# Patient Record
Sex: Female | Born: 1975 | Race: White | Hispanic: No | Marital: Married | Smoking: Former smoker
Health system: Southern US, Community
[De-identification: ages and names within clinical notes are randomized; demographics above are authoritative.]

## PROBLEM LIST (undated history)

## (undated) ENCOUNTER — Emergency Department (HOSPITAL_BASED_OUTPATIENT_CLINIC_OR_DEPARTMENT_OTHER): Discharge status: Absent without leave | Payer: No Typology Code available for payment source

## (undated) DIAGNOSIS — F32A Depression, unspecified: Secondary | ICD-10-CM

## (undated) DIAGNOSIS — F329 Major depressive disorder, single episode, unspecified: Secondary | ICD-10-CM

---

## 1997-05-08 ENCOUNTER — Inpatient Hospital Stay (HOSPITAL_COMMUNITY): Admission: AD | Admit: 1997-05-08 | Discharge: 1997-05-08 | Payer: Self-pay | Admitting: Obstetrics and Gynecology

## 1997-06-03 ENCOUNTER — Inpatient Hospital Stay (HOSPITAL_COMMUNITY): Admission: AD | Admit: 1997-06-03 | Discharge: 1997-06-06 | Payer: Self-pay | Admitting: Obstetrics and Gynecology

## 1997-07-23 ENCOUNTER — Other Ambulatory Visit: Admission: RE | Admit: 1997-07-23 | Discharge: 1997-07-23 | Payer: Self-pay | Admitting: Obstetrics & Gynecology

## 2002-07-16 ENCOUNTER — Emergency Department (HOSPITAL_COMMUNITY): Admission: EM | Admit: 2002-07-16 | Discharge: 2002-07-16 | Payer: Self-pay | Admitting: Emergency Medicine

## 2002-07-16 ENCOUNTER — Encounter: Payer: Self-pay | Admitting: Emergency Medicine

## 2002-09-06 ENCOUNTER — Encounter: Payer: Self-pay | Admitting: Occupational Medicine

## 2002-09-06 ENCOUNTER — Encounter: Admission: RE | Admit: 2002-09-06 | Discharge: 2002-09-06 | Payer: Self-pay | Admitting: Occupational Medicine

## 2007-04-10 ENCOUNTER — Encounter: Admission: RE | Admit: 2007-04-10 | Discharge: 2007-04-10 | Payer: Self-pay | Admitting: Rheumatology

## 2009-08-20 ENCOUNTER — Encounter: Admission: RE | Admit: 2009-08-20 | Discharge: 2009-08-20 | Payer: Self-pay | Admitting: Obstetrics and Gynecology

## 2009-10-14 ENCOUNTER — Inpatient Hospital Stay (HOSPITAL_COMMUNITY): Admission: RE | Admit: 2009-10-14 | Discharge: 2009-10-17 | Payer: Self-pay | Admitting: Obstetrics & Gynecology

## 2010-04-16 LAB — CBC
Hemoglobin: 10.5 g/dL — ABNORMAL LOW (ref 12.0–15.0)
Hemoglobin: 12.5 g/dL (ref 12.0–15.0)
MCH: 32 pg (ref 26.0–34.0)
MCHC: 33.8 g/dL (ref 30.0–36.0)
Platelets: 215 10*3/uL (ref 150–400)
RBC: 3.88 MIL/uL (ref 3.87–5.11)
RDW: 14.7 % (ref 11.5–15.5)
WBC: 12.1 10*3/uL — ABNORMAL HIGH (ref 4.0–10.5)

## 2010-04-16 LAB — SURGICAL PCR SCREEN
MRSA, PCR: NEGATIVE
Staphylococcus aureus: NEGATIVE

## 2010-04-16 LAB — RPR: RPR Ser Ql: NONREACTIVE

## 2013-04-10 ENCOUNTER — Other Ambulatory Visit: Payer: Self-pay | Admitting: Obstetrics & Gynecology

## 2013-04-10 DIAGNOSIS — Z1231 Encounter for screening mammogram for malignant neoplasm of breast: Secondary | ICD-10-CM

## 2013-05-09 ENCOUNTER — Other Ambulatory Visit: Payer: Self-pay

## 2013-05-09 DIAGNOSIS — Z1231 Encounter for screening mammogram for malignant neoplasm of breast: Secondary | ICD-10-CM

## 2014-09-29 ENCOUNTER — Encounter (HOSPITAL_BASED_OUTPATIENT_CLINIC_OR_DEPARTMENT_OTHER): Payer: Self-pay | Admitting: Emergency Medicine

## 2014-09-29 ENCOUNTER — Emergency Department (HOSPITAL_BASED_OUTPATIENT_CLINIC_OR_DEPARTMENT_OTHER)
Admission: EM | Admit: 2014-09-29 | Discharge: 2014-09-29 | Disposition: A | Discharge status: Home / Self Care | Payer: No Typology Code available for payment source | Attending: Emergency Medicine | Admitting: Emergency Medicine

## 2014-09-29 DIAGNOSIS — Y9389 Activity, other specified: Secondary | ICD-10-CM | POA: Insufficient documentation

## 2014-09-29 DIAGNOSIS — Z79899 Other long term (current) drug therapy: Secondary | ICD-10-CM | POA: Insufficient documentation

## 2014-09-29 DIAGNOSIS — F329 Major depressive disorder, single episode, unspecified: Secondary | ICD-10-CM | POA: Diagnosis not present

## 2014-09-29 DIAGNOSIS — Y92410 Unspecified street and highway as the place of occurrence of the external cause: Secondary | ICD-10-CM | POA: Diagnosis not present

## 2014-09-29 DIAGNOSIS — Y998 Other external cause status: Secondary | ICD-10-CM | POA: Diagnosis not present

## 2014-09-29 DIAGNOSIS — T148XXA Other injury of unspecified body region, initial encounter: Secondary | ICD-10-CM

## 2014-09-29 DIAGNOSIS — Z87891 Personal history of nicotine dependence: Secondary | ICD-10-CM | POA: Insufficient documentation

## 2014-09-29 DIAGNOSIS — S46912A Strain of unspecified muscle, fascia and tendon at shoulder and upper arm level, left arm, initial encounter: Secondary | ICD-10-CM | POA: Insufficient documentation

## 2014-09-29 DIAGNOSIS — S199XXA Unspecified injury of neck, initial encounter: Secondary | ICD-10-CM | POA: Insufficient documentation

## 2014-09-29 DIAGNOSIS — S4992XA Unspecified injury of left shoulder and upper arm, initial encounter: Secondary | ICD-10-CM | POA: Diagnosis present

## 2014-09-29 HISTORY — DX: Depression, unspecified: F32.A

## 2014-09-29 HISTORY — DX: Major depressive disorder, single episode, unspecified: F32.9

## 2014-09-29 MED ORDER — OXYCODONE-ACETAMINOPHEN 5-325 MG PO TABS
2.0000 | ORAL_TABLET | Freq: Once | ORAL | Status: AC
  Administered 2014-09-29: 2 via ORAL
  Filled 2014-09-29: qty 2

## 2014-09-29 MED ORDER — IBUPROFEN 800 MG PO TABS
800.0000 mg | ORAL_TABLET | Freq: Once | ORAL | Status: AC
  Administered 2014-09-29: 800 mg via ORAL
  Filled 2014-09-29: qty 1

## 2014-09-29 NOTE — ED Notes (Signed)
Pt able to use left arm without apparent difficulty or disability while taking medications

## 2014-09-29 NOTE — ED Notes (Signed)
EDP Dr. Rosalia Hammers into room.

## 2014-09-29 NOTE — ED Notes (Signed)
Pt care assumed after Dr exam, pt cleared for discharge.
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
## 2015-08-07 ENCOUNTER — Other Ambulatory Visit: Payer: Self-pay | Admitting: Obstetrics & Gynecology

## 2015-08-07 DIAGNOSIS — R928 Other abnormal and inconclusive findings on diagnostic imaging of breast: Secondary | ICD-10-CM

## 2015-08-12 ENCOUNTER — Ambulatory Visit
Admission: RE | Admit: 2015-08-12 | Discharge: 2015-08-12 | Disposition: A | Discharge status: Home / Self Care | Payer: BLUE CROSS/BLUE SHIELD | Source: Ambulatory Visit | Attending: Obstetrics & Gynecology | Admitting: Obstetrics & Gynecology

## 2015-08-12 DIAGNOSIS — R928 Other abnormal and inconclusive findings on diagnostic imaging of breast: Secondary | ICD-10-CM

## 2017-01-05 ENCOUNTER — Other Ambulatory Visit: Payer: Self-pay | Admitting: Obstetrics & Gynecology

## 2017-01-05 DIAGNOSIS — Z1231 Encounter for screening mammogram for malignant neoplasm of breast: Secondary | ICD-10-CM

## 2017-01-06 ENCOUNTER — Ambulatory Visit
Admission: RE | Admit: 2017-01-06 | Discharge: 2017-01-06 | Disposition: A | Discharge status: Home / Self Care | Payer: BLUE CROSS/BLUE SHIELD | Source: Ambulatory Visit | Attending: Obstetrics & Gynecology | Admitting: Obstetrics & Gynecology

## 2017-01-06 DIAGNOSIS — Z1231 Encounter for screening mammogram for malignant neoplasm of breast: Secondary | ICD-10-CM

## 2018-11-15 ENCOUNTER — Other Ambulatory Visit: Payer: Self-pay | Admitting: Family Medicine

## 2018-11-15 DIAGNOSIS — Z1231 Encounter for screening mammogram for malignant neoplasm of breast: Secondary | ICD-10-CM

## 2019-01-04 ENCOUNTER — Ambulatory Visit
Admission: RE | Admit: 2019-01-04 | Discharge: 2019-01-04 | Disposition: A | Discharge status: Home / Self Care | Payer: BLUE CROSS/BLUE SHIELD | Source: Ambulatory Visit | Attending: Family Medicine | Admitting: Family Medicine

## 2019-01-04 ENCOUNTER — Other Ambulatory Visit: Payer: Self-pay

## 2019-01-04 DIAGNOSIS — Z1231 Encounter for screening mammogram for malignant neoplasm of breast: Secondary | ICD-10-CM

## 2020-06-20 IMAGING — MG DIGITAL SCREENING BILAT W/ CAD
2 series · 2 of 2 positions shown · non-contrast
Comparison: Previous exam(s).

CLINICAL DATA: Screening.

EXAM:
DIGITAL SCREENING BILATERAL MAMMOGRAM WITH CAD

[L CC]
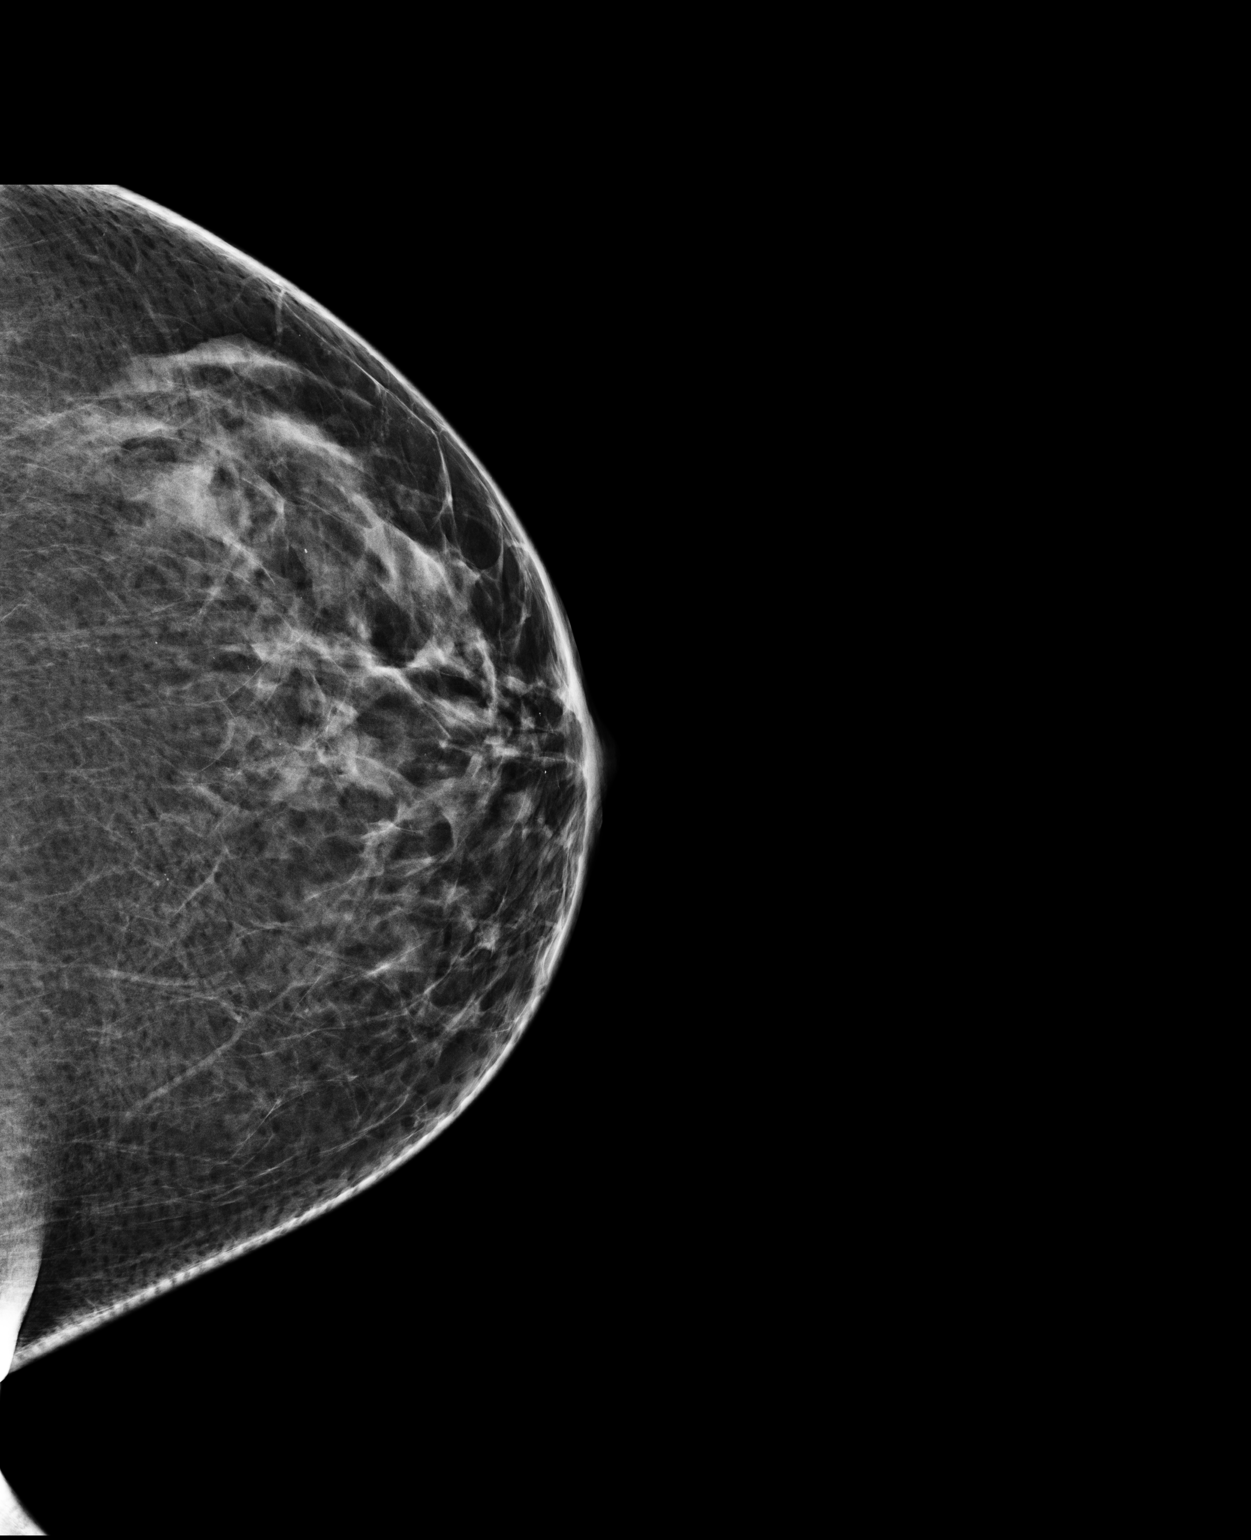

[R CC]
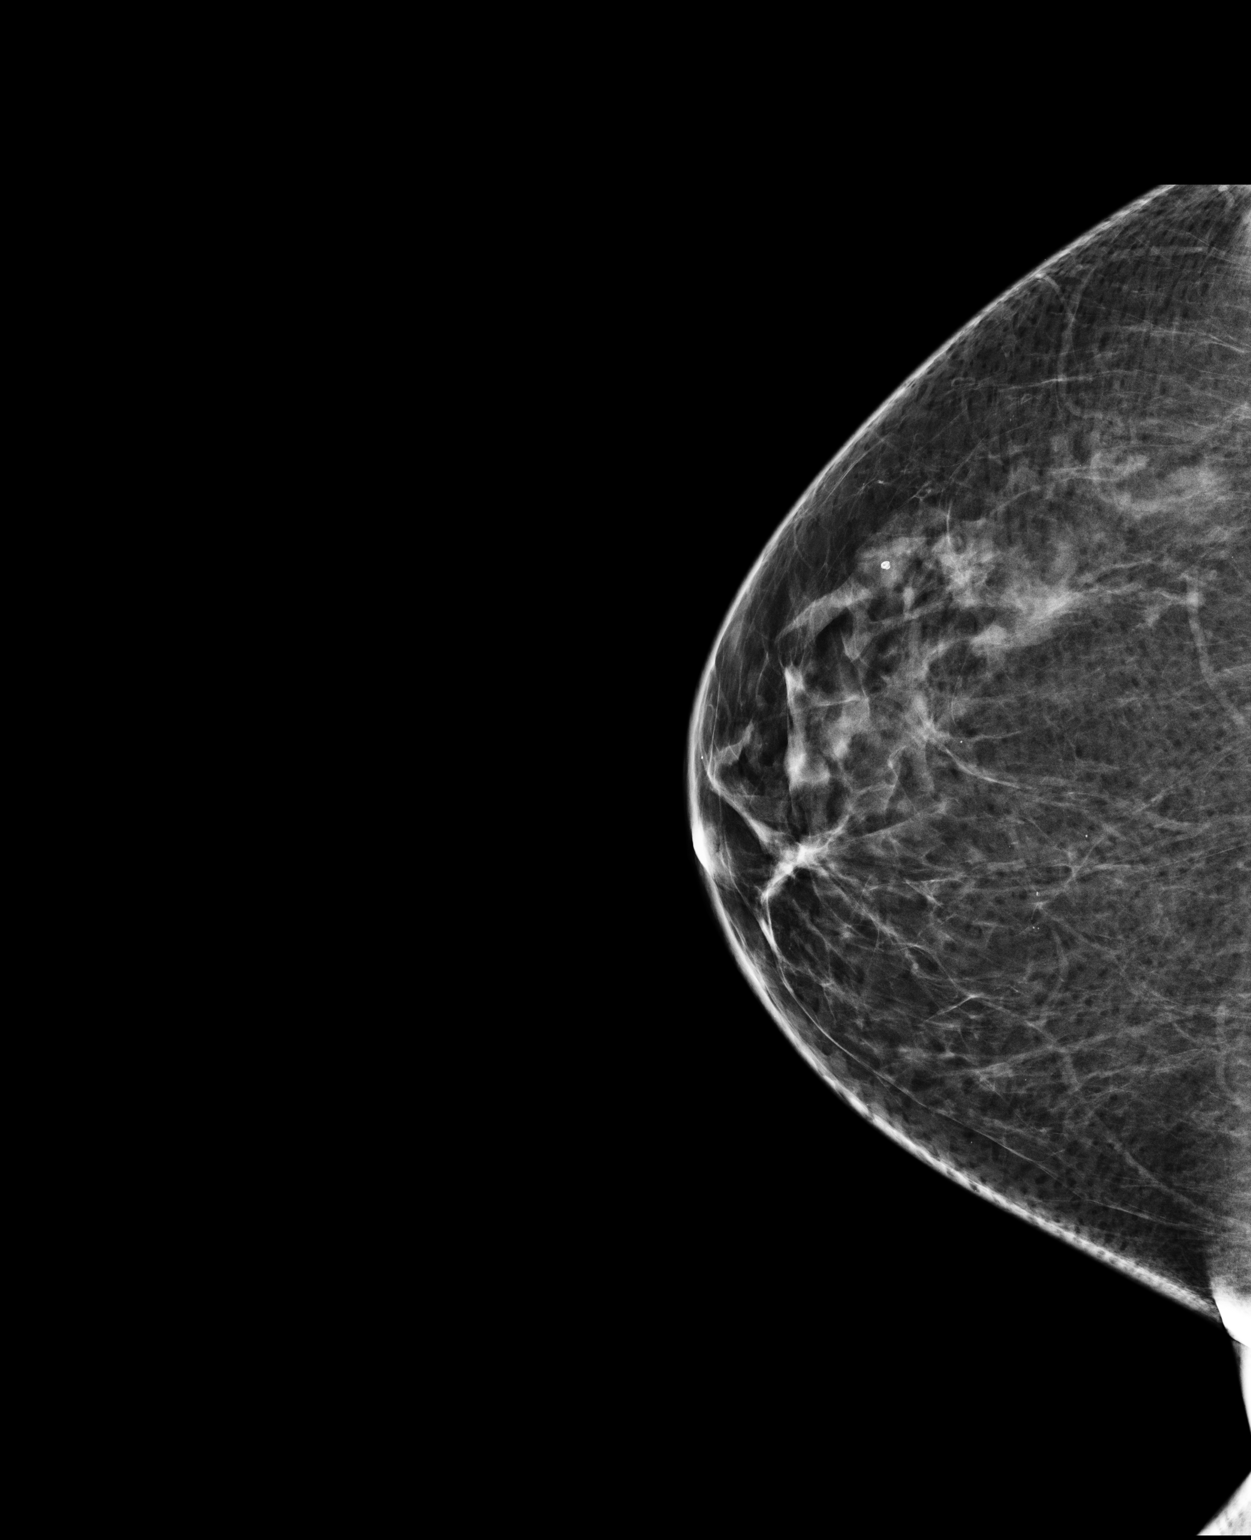

[2 of 2 positions shown; findings below may reference images not displayed]

ACR Breast Density Category c: The breast tissue is heterogeneously
dense, which may obscure small masses.
FINDINGS: There are no findings suspicious for malignancy. Images were
processed with CAD.
IMPRESSION: No mammographic evidence of malignancy. A result letter of this
screening mammogram will be mailed directly to the patient.

RECOMMENDATION:
Screening mammogram in one year. (Code:[id number])

BI-RADS CATEGORY  1: Negative.
